# Patient Record
Sex: Male | Born: 1970 | Race: Black or African American | Hispanic: No | Marital: Married | Smoking: Never smoker
Health system: Southern US, Community
[De-identification: ages and names within clinical notes are randomized; demographics above are authoritative.]

## PROBLEM LIST (undated history)

## (undated) DIAGNOSIS — F909 Attention-deficit hyperactivity disorder, unspecified type: Secondary | ICD-10-CM

## (undated) DIAGNOSIS — K219 Gastro-esophageal reflux disease without esophagitis: Secondary | ICD-10-CM

## (undated) DIAGNOSIS — F329 Major depressive disorder, single episode, unspecified: Secondary | ICD-10-CM

## (undated) DIAGNOSIS — G8929 Other chronic pain: Secondary | ICD-10-CM

## (undated) DIAGNOSIS — F32A Depression, unspecified: Secondary | ICD-10-CM

## (undated) HISTORY — PX: FRACTURE SURGERY: SHX138

---

## 2005-09-27 ENCOUNTER — Encounter: Admission: RE | Admit: 2005-09-27 | Discharge: 2005-09-27 | Payer: Self-pay | Admitting: Orthopedic Surgery

## 2005-10-23 ENCOUNTER — Encounter: Admission: RE | Admit: 2005-10-23 | Discharge: 2005-10-23 | Payer: Self-pay | Admitting: Orthopedic Surgery

## 2008-04-18 IMAGING — US US EXTREM LOW VENOUS*L*
1 series · 14 of 24 positions shown · non-contrast
Comparison: None.

CLINICAL DATA: Left calf pain and swelling.   History of surgery on [REDACTED], 8332.
 LEFT LOWER EXTREMITY VENOUS DOPPLER ULTRASOUND:
TECHNIQUE: Gray scale sonography with compression, as well as color and duplex Doppler ultrasound, were performed to evaluate the deep venous system from the level of the common femoral vein through the popliteal and proximal calf veins.

[Series 1: unknown · 14 of 28 slices shown]
[im 1/28]
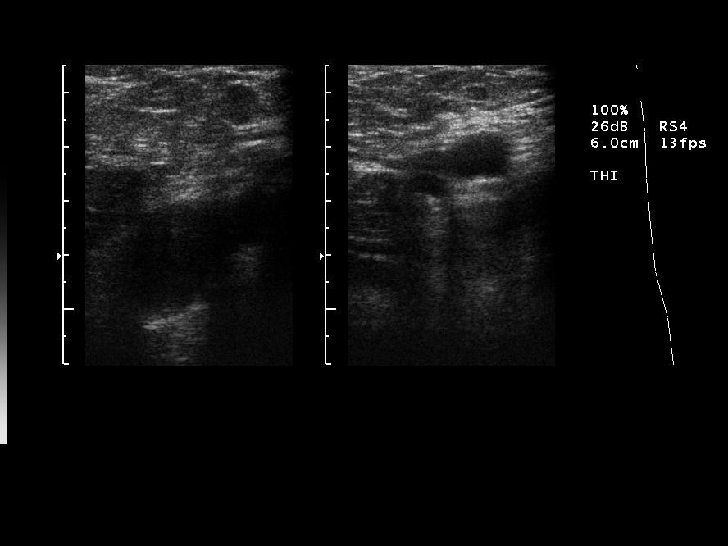
[im 3/28]
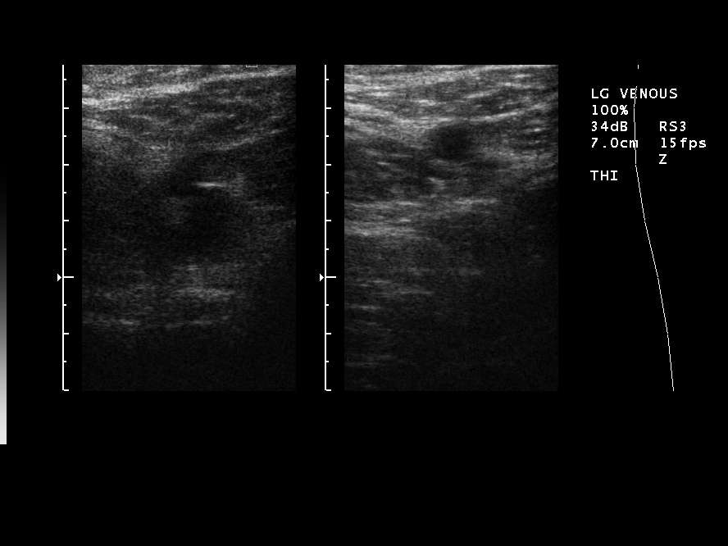
[im 5/28]
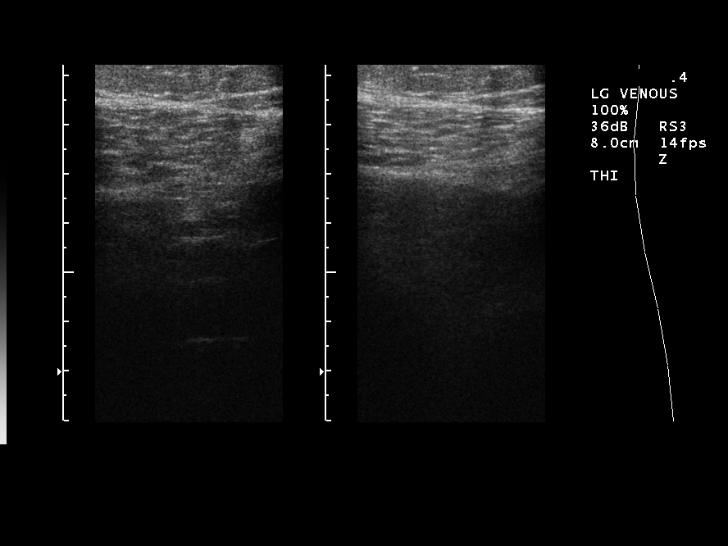
[im 8/28]
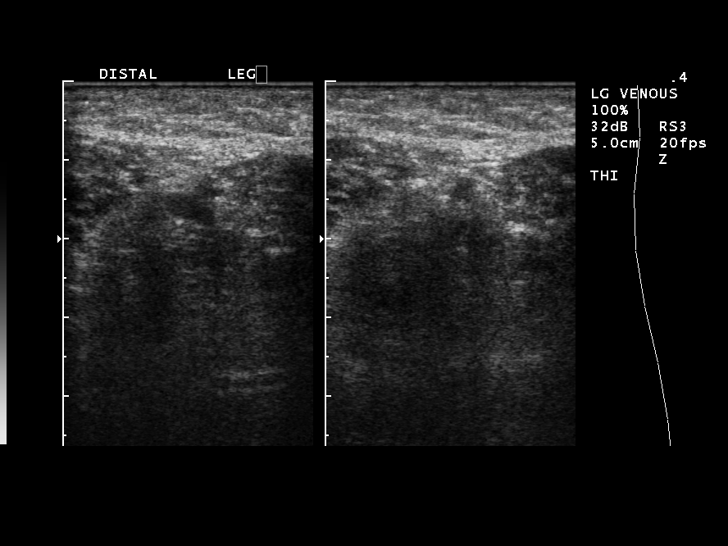
[im 9/28]
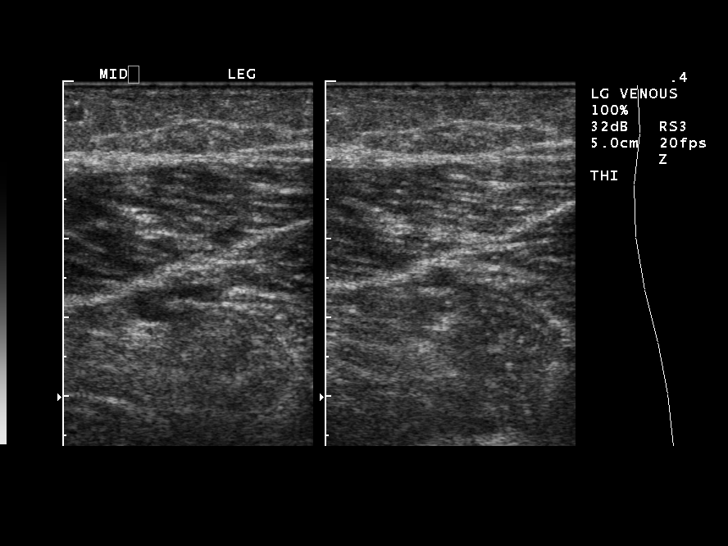
[im 11/28]
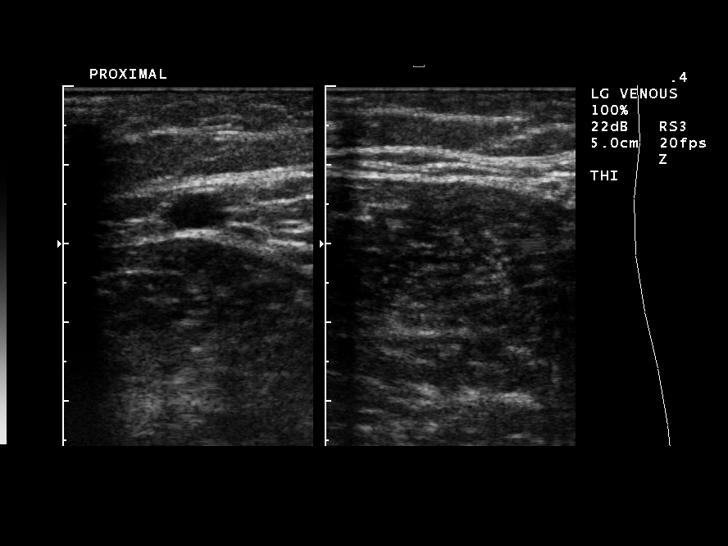
[im 13/28]
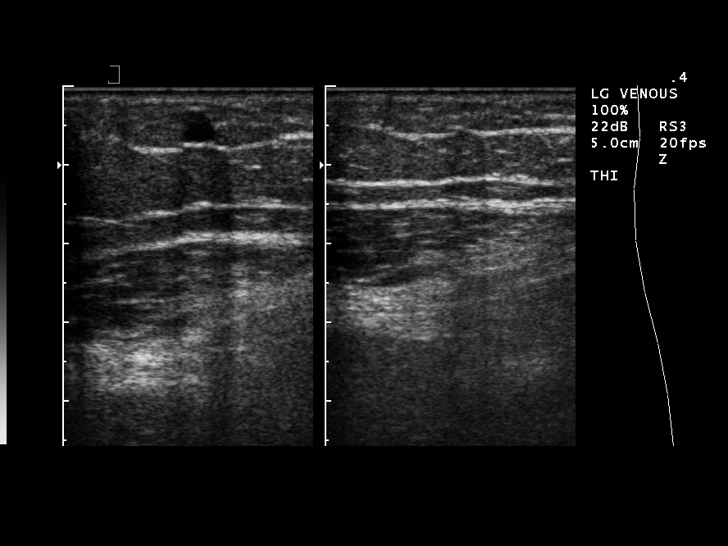
[im 15/28]
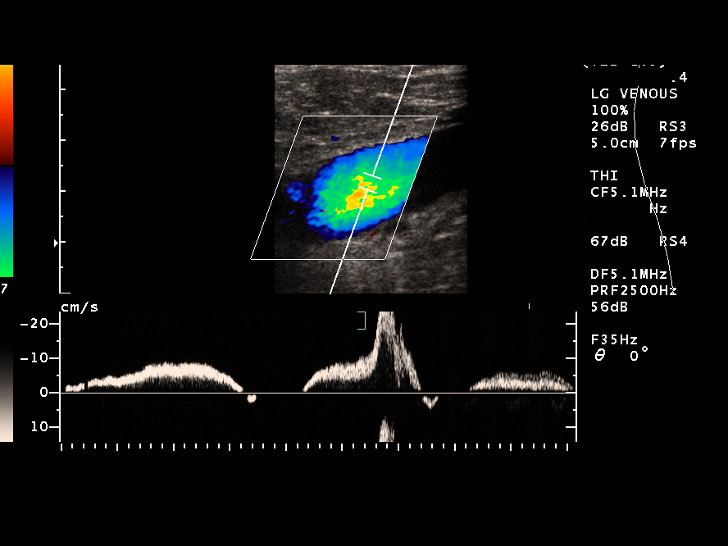
[im 17/28]
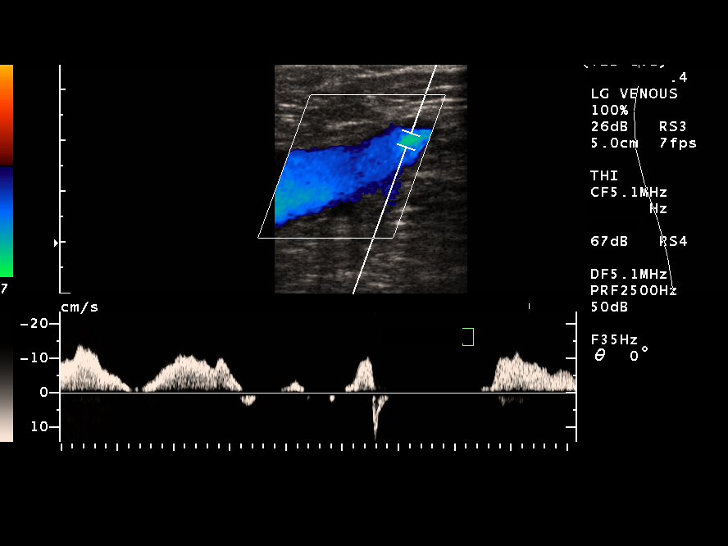
[im 19/28]
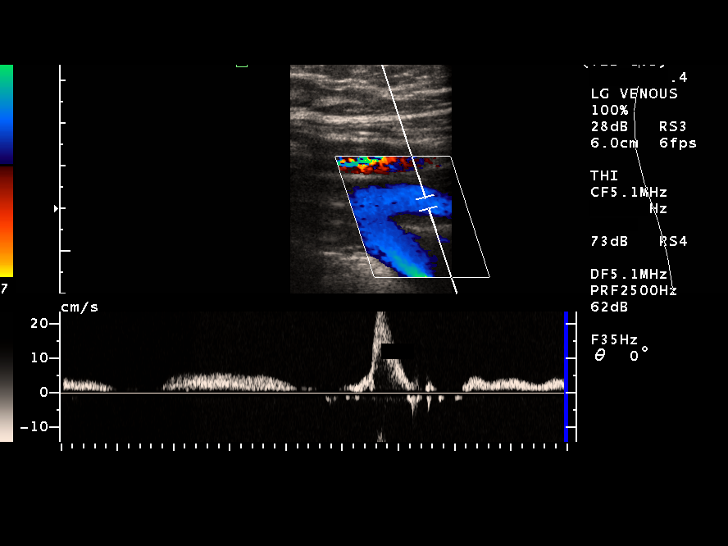
[im 22/28]
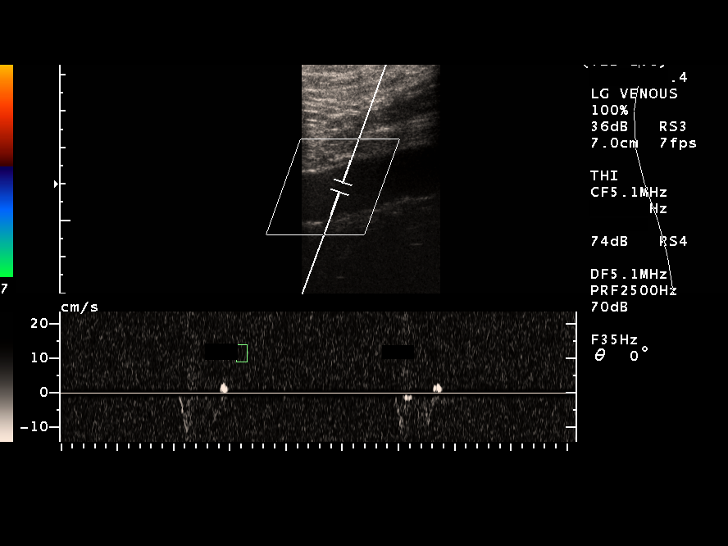
[im 23/28]
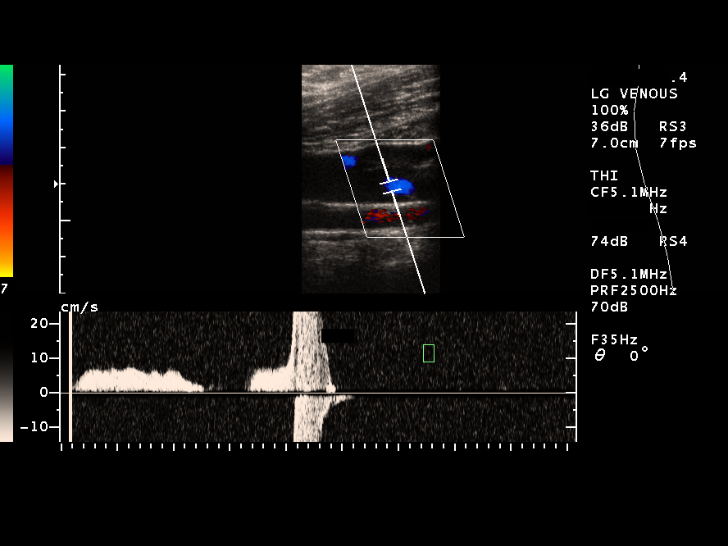
[im 25/28]
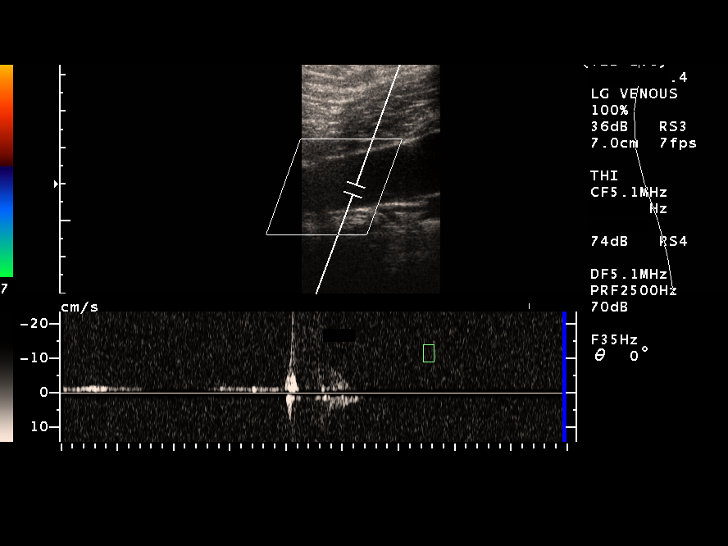
[im 28/28]
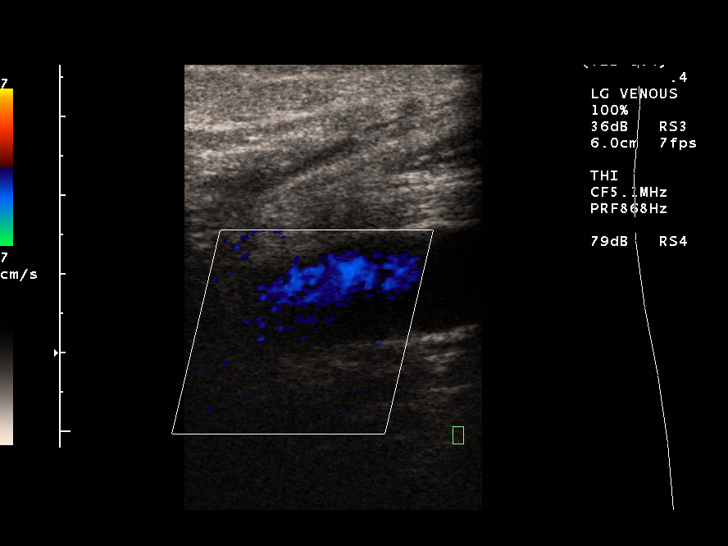

[14 of 24 positions shown; findings below may reference images not displayed]

FINDINGS: Normal flow, compressibility, and augmentation within the left common femoral, superficial femoral, popliteal and posterior tibial veins.  Saphenous system is patent.
IMPRESSION: No DVT.

## 2011-07-19 ENCOUNTER — Emergency Department (INDEPENDENT_AMBULATORY_CARE_PROVIDER_SITE_OTHER)

## 2011-07-19 ENCOUNTER — Encounter (HOSPITAL_BASED_OUTPATIENT_CLINIC_OR_DEPARTMENT_OTHER): Payer: Self-pay | Admitting: Emergency Medicine

## 2011-07-19 ENCOUNTER — Emergency Department (HOSPITAL_BASED_OUTPATIENT_CLINIC_OR_DEPARTMENT_OTHER)
Admission: EM | Admit: 2011-07-19 | Discharge: 2011-07-19 | Disposition: A | Discharge status: Home / Self Care | Attending: Emergency Medicine | Admitting: Emergency Medicine

## 2011-07-19 DIAGNOSIS — R079 Chest pain, unspecified: Secondary | ICD-10-CM

## 2011-07-19 DIAGNOSIS — R0602 Shortness of breath: Secondary | ICD-10-CM

## 2011-07-19 DIAGNOSIS — K219 Gastro-esophageal reflux disease without esophagitis: Secondary | ICD-10-CM | POA: Insufficient documentation

## 2011-07-19 HISTORY — DX: Depression, unspecified: F32.A

## 2011-07-19 HISTORY — DX: Major depressive disorder, single episode, unspecified: F32.9

## 2011-07-19 HISTORY — DX: Gastro-esophageal reflux disease without esophagitis: K21.9

## 2011-07-19 LAB — TROPONIN I
Troponin I: <0.3 ng/mL (ref <0.30)
Troponin I: <0.3 ng/mL (ref <0.30)

## 2011-07-19 LAB — D-DIMER, QUANTITATIVE: D-Dimer, Quant: <0.22 ug/mL-FEU (ref 0.00–0.48)

## 2011-07-19 LAB — BASIC METABOLIC PANEL
CO2: 27 mEq/L (ref 19–32)
Calcium: 8.7 mg/dL (ref 8.4–10.5)
Chloride: 104 mEq/L (ref 96–112)
GFR calc non Af Amer: >90 mL/min (ref >90)
Potassium: 3.8 mEq/L (ref 3.5–5.1)

## 2011-07-19 LAB — CBC: MCV: 88.4 fL (ref 78.0–100.0)

## 2011-07-19 MED ORDER — ASPIRIN 81 MG PO CHEW
324.0000 mg | CHEWABLE_TABLET | Freq: Once | ORAL | Status: AC
  Administered 2011-07-19: 324 mg via ORAL
  Filled 2011-07-19: qty 3

## 2011-07-19 NOTE — ED Notes (Signed)
Patient transported to X-ray 

## 2011-07-19 NOTE — Discharge Instructions (Signed)
Aspirin and Your Heart Aspirin affects the way your blood clots and helps "thin" the blood. Aspirin has many uses in heart disease. It may be used as a primary prevention to help reduce the risk of heart related events. It also can be used as a secondary measure to prevent more heart attacks or to prevent additional damage from blood clots.  ASPIRIN MAY HELP IF YOU:  Have had a heart attack or chest pain.   Have undergone open heart surgery such as CABG (Coronary Artery Bypass Surgery).   Have had coronary angioplasty with or without stents.   Have experienced a stroke or TIA (transient ischemic attack).   Have peripheral vascular disease (PAD).   Have chronic heart rhythm problems such as atrial fibrillation.   Are at risk for heart disease.  BEFORE STARTING ASPIRIN Before you start taking aspirin, your caregiver will need to review your medical history. Many things will need to be taken into consideration, such as:  Smoking status.   Blood pressure.   Diabetes.   Gender.   Weight.   Cholesterol level.  ASPIRIN DOSES  Aspirin should only be taken on the advice of your caregiver. Talk to your caregiver about how much aspirin you should take. Aspirin comes in different doses such as:   81 mg.   162 mg.   325 mg.   The aspirin dose you take may be affected by many factors, some of which include:   Your current medications, especially if your are taking blood-thinners or anti-platelet medicine.   Liver function.   Heart disease risk.   Age.   Aspirin comes in two forms:   Non-enteric-coated. This type of aspirin does not have a coating and is absorbed faster. Non-enteric coated aspirin is recommended for patients experiencing chest pain symptoms. This type of aspirin also comes in a chewable form.   Enteric-coated. This means the aspirin has a special coating that releases the medicine very slowly. Enteric-coated aspirin causes less stomach upset. This type of  aspirin should not be chewed or crushed.  ASPIRIN SIDE EFFECTS Daily use of aspirin can increase your risk of serious side effects, some of these include:  Increased bleeding. This can range from a cut that does not stop bleeding to more serious problems such as stomach bleeding or bleeding into the brain (Intracerebral bleeding).   Increased bruising.   Stomach upset.   An allergic reaction such as red, itchy skin.   Increased risk of bleeding when combined with non-steroidal anti-inflammatory medicine (NSAIDS).   Alcohol should be drank in moderation when taking aspirin. Alcohol can increase the risk of stomach bleeding when taken with aspirin.   Aspirin should not be given to children less than 18 years of age due to the association of Reye syndrome. Reye syndrome is a serious illness that can affect the brain and liver. Studies have linked Reye syndrome with aspirin use in children.   People that have nasal polyps have an increased risk of developing an aspirin allergy.  SEEK MEDICAL CARE IF:   You develop an allergic reaction such as:   Hives.   Itchy skin.   Swelling of the lips, tongue or face.   You develop stomach pain.   You have unusual bleeding or bruising.   You have ringing in your ears.  SEEK IMMEDIATE MEDICAL CARE IF:   You have severe chest pain, especially if the pain is crushing or pressure-like and spreads to the arms, back, neck, or jaw. THIS   IS AN EMERGENCY. Do not wait to see if the pain will go away. Get medical help at once. Call your local emergency services (911 in the [place] DO NOT drive yourself to the hospital.   You have stroke-like symptoms such as:   Loss of vision.   Difficulty talking.   Numbness or weakness on one side of your body.   Numbness or weakness in your arm or leg.   Not thinking clearly or feeling confused.   Your bowel movements are bloody, dark red or black in color.   You vomit or cough up blood.   You have blood  in your urine.   You have shortness of breath, coughing or wheezing.  MAKE SURE YOU:   Understand these instructions.   Will monitor your condition.   Seek immediate medical care if necessary.  Document Released: 03/02/2008 Document Revised: 03/09/2011 Document Reviewed: 03/02/2008 [facility] Patient Information 2012 [place], [place].  Recommend starting a baby aspirin a day. Return for new or worse symptoms. Resource guide provided to help you find a primary care doctor as needed. If you have a primary care doctor followup in the next few days.    RESOURCE GUIDE  Dental Problems  Patients with Medicaid: [facility] [phone] [address]                                           [phone] [address] Phone:  [phone]                                                  Phone:  [phone]  If unable to pay or uninsured, contact:  [facility] or [facility]. to become qualified for the adult dental clinic.  Chronic Pain Problems Contact [facility]  [phone] Patients need to be referred by their primary care doctor.  Insufficient Money for Medicine Contact [employer]:  call "211" or [facility] 636-090-0359.  No Primary Care Doctor Call [facility]  [phone] Other agencies that provide inexpensive medical care    [facility]  [phone]    [facility]  [phone]    [facility]  [phone]    [facility]  [phone]    [facility]  978-600-2450    [facility]  [phone]  Psychological Services [facility] Behavioral Health  [phone] [facility] Services  [phone] [facility] Mental Health   [phone] (emergency services (431)[phone])  Substance Abuse Resources [facility]  (808)870-7151 [facility] [phone] [facility] ([phone] [facility] (307) [phone] Residential & Outpatient Substance Abuse  Program  (978)583-6574  Abuse/Neglect [facility] Child Abuse Hotline 705-101-7346 [facility] Child Abuse Hotline (562) 013-5865 (After Hours)  Emergency Shelter [facility] (484)442-5593  Maternity Homes [facility] at the [place] [facility] [place] (902)[phone] [facility] [phone]  MRSA Hotline #:   616-274-8438    [facility]  [facility] [address] [place]                       [address]      [address]  [place]                                                [place]                            [place] Phone:  [phone]                                   Phone:  [phone]                 Phone:  [phone]  [place] [facility] Phone:  [phone]  [place] Child Abuse Hotline (336) 342-1394 (336) [phone] (After Hours)    

## 2011-07-19 NOTE — ED Notes (Signed)
I met patient in waiting room with complaint of chest pain, I took patient back to room, took vitals and got ecg. I gave copy to Dr. Jodelle Gross.

## 2011-07-19 NOTE — ED Provider Notes (Signed)
History     CSN: [id number]  Arrival date & time 07/19/11  1005   First MD Initiated Contact with Patient 07/19/11 1024      Chief Complaint  Patient presents with  . Chest Pain    (Consider location/radiation/quality/duration/timing/severity/associated sxs/prior treatment) Patient is a 41 y.o. male presenting with chest pain. The history is provided by the patient.  Chest Pain The chest pain began 1 - 2 hours ago (Onset of chest pain was approximately 9:30 in the morning. This is when the pain became constant patient has had intermittent chest pain on and off for several days. Approximately 3 days.). Chest pain occurs constantly. The chest pain is improving. At its most intense, the pain is at 6/10. The severity of the pain is moderate. The quality of the pain is described as aching and dull. The pain does not radiate. Primary symptoms include shortness of breath. Pertinent negatives for primary symptoms include no fever, no syncope, no cough, no wheezing, no abdominal pain, no nausea, no vomiting and no dizziness.     Past Medical History  Diagnosis Date  . Depression   . GERD (gastroesophageal reflux disease)     Past Surgical History  Procedure Date  . Fracture surgery     No family history on file.  History  Substance Use Topics  . Smoking status: Not on file  . Smokeless tobacco: Not on file  . Alcohol Use: Yes     Drinks occasionally and may be wine, beer or liquor      Review of Systems  Constitutional: Negative for fever.  HENT: Negative for congestion and neck pain.   Eyes: Negative for visual disturbance.  Respiratory: Positive for shortness of breath. Negative for cough and wheezing.   Cardiovascular: Positive for chest pain. Negative for syncope.  Gastrointestinal: Negative for nausea, vomiting and abdominal pain.  Genitourinary: Positive for dysuria.  Musculoskeletal: Negative for back pain.  Skin: Negative for rash.  Neurological: Negative for  dizziness and headaches.  Hematological: Does not bruise/bleed easily.    Allergies  Doxycycline and Tetracyclines & related  Home Medications   Current Outpatient Rx  Name Route Sig Dispense Refill  . PANTOPRAZOLE SODIUM 40 MG PO TBEC Oral Take 40 mg by mouth daily.    Marland Kitchen TACROLIMUS 0.1 % EX OINT Topical Apply 1 application topically 2 (two) times daily. Patient uses this medication for skin.      BP 132/81  Pulse 63  Temp(Src) 97.9 F (36.6 C) (Oral)  Resp 16  SpO2 97%  Physical Exam  Nursing note and vitals reviewed. Constitutional: He is oriented to person, place, and time. He appears well-developed and well-nourished. No distress.  HENT:  Head: Normocephalic and atraumatic.  Mouth/Throat: Oropharynx is clear and moist.  Eyes: Conjunctivae and EOM are normal. Pupils are equal, round, and reactive to light.  Neck: Normal range of motion. Neck supple.  Cardiovascular: Normal rate, regular rhythm and normal heart sounds.   No murmur heard. Pulmonary/Chest: Effort normal and breath sounds normal. No respiratory distress. He has no wheezes. He has no rales. He exhibits no tenderness.  Abdominal: Soft. Bowel sounds are normal. There is no tenderness.  Musculoskeletal: Normal range of motion. He exhibits no edema.  Neurological: He is alert and oriented to person, place, and time. No cranial nerve deficit. He exhibits normal muscle tone. Coordination normal.  Skin: Skin is warm. No rash noted.    ED Course  Procedures (including critical care time)  Labs Reviewed  CBC - Abnormal; Notable for the following:    MCHC 36.5 (*)    Platelets 134 (*)    All other components within normal limits  BASIC METABOLIC PANEL - Abnormal; Notable for the following:    Glucose, Bld 68 (*)    All other components within normal limits  TROPONIN I  D-DIMER, QUANTITATIVE  TROPONIN I   Dg Chest 2 View  07/19/2011  *RADIOLOGY REPORT*  Clinical Data: Chest pain, shortness of breath  CHEST -  2 VIEW  Comparison: None.  Findings: Cardiomediastinal silhouette is unremarkable.  No acute infiltrate or pleural effusion.  No pulmonary edema.  Bony thorax is unremarkable.  IMPRESSION: No active disease.  Original Report Authenticated By: Natasha Mead, M.D.   Results for orders placed during the hospital encounter of 07/19/11  CBC      Component Value Range   WBC 4.8  4.0 - 10.5 (K/uL)   RBC 4.74  4.22 - 5.81 (MIL/uL)   Hemoglobin 15.3  13.0 - 17.0 (g/dL)   HCT 16.1  09.6 - 04.5 (%)   MCV 88.4  78.0 - 100.0 (fL)   MCH 32.3  26.0 - 34.0 (pg)   MCHC 36.5 (*) 30.0 - 36.0 (g/dL)   RDW 40.9  81.1 - 91.4 (%)   Platelets 134 (*) 150 - 400 (K/uL)  BASIC METABOLIC PANEL      Component Value Range   Sodium 140  135 - 145 (mEq/L)   Potassium 3.8  3.5 - 5.1 (mEq/L)   Chloride 104  96 - 112 (mEq/L)   CO2 27  19 - 32 (mEq/L)   Glucose, Bld 68 (*) 70 - 99 (mg/dL)   BUN 12  6 - 23 (mg/dL)   Creatinine, Ser 7.82  0.50 - 1.35 (mg/dL)   Calcium 8.7  8.4 - 95.6 (mg/dL)   GFR calc non Af Amer >90  >90 (mL/min)   GFR calc Af Amer >90  >90 (mL/min)  TROPONIN I      Component Value Range   Troponin I <0.30  <0.30 (ng/mL)  D-DIMER, QUANTITATIVE      Component Value Range   D-Dimer, Quant <0.22  0.00 - 0.48 (ug/mL-FEU)     Date: 07/19/2011  Rate: 72  Rhythm: normal sinus rhythm  QRS Axis: normal  Intervals: normal  ST/T Wave abnormalities: normal  Conduction Disutrbances:first-degree A-V block   Narrative Interpretation:   Old EKG Reviewed: none available Correction no conduction disturbance no first degree AV block.   1. Chest pain       MDM   Initial lab workup without specific findings EKG without acute changes troponin negative d-dimer negative chest x-ray without pneumonia or pneumothorax.   Repeat troponin at 3:30 represents a six-hour troponin mark that is still pending.  As stated above initial workup without evidence of pulmonary embolism in no acute cardiac changes. Labs  without specific abnormalities.  If patient discharged home recommend starting baby aspirin a day resource guide provided for referrals to primary care doctors.        Shelda Jakes, MD 07/19/11 [phone]

## 2011-07-19 NOTE — ED Notes (Signed)
Patient states he started having chest pain approximately 3 days ago.  Pain is midsternal and non radiating.  Pain has been intermittent and patient is unable to give characteristics.  Has had nausea dizziness.  Has used antacids due to heart burn but states did not change feeling in chest.

## 2013-10-20 ENCOUNTER — Encounter (HOSPITAL_BASED_OUTPATIENT_CLINIC_OR_DEPARTMENT_OTHER): Payer: Self-pay | Admitting: Emergency Medicine

## 2013-10-20 DIAGNOSIS — Z8659 Personal history of other mental and behavioral disorders: Secondary | ICD-10-CM | POA: Insufficient documentation

## 2013-10-20 DIAGNOSIS — K2970 Gastritis, unspecified, without bleeding: Secondary | ICD-10-CM | POA: Insufficient documentation

## 2013-10-20 DIAGNOSIS — K219 Gastro-esophageal reflux disease without esophagitis: Secondary | ICD-10-CM | POA: Insufficient documentation

## 2013-10-20 DIAGNOSIS — R197 Diarrhea, unspecified: Secondary | ICD-10-CM | POA: Insufficient documentation

## 2013-10-20 DIAGNOSIS — K2990 Gastroduodenitis, unspecified, without bleeding: Principal | ICD-10-CM

## 2013-10-20 NOTE — ED Notes (Signed)
Pt reports that he has had abominal pain x 1 month, has been seen at his pcp office and treated for refux, , reports significant loss of weight in last month. States that he has continued to reflux medication with no improvement

## 2013-10-20 NOTE — ED Provider Notes (Signed)
CSN: [id number]     Arrival date & time 10/20/13  2348 History  This chart was scribed for Guy SeamenJohn L Charmain Diosdado, MD by Nicholos Johnsenise Iheanachor, ED scribe. This patient was seen in room MH02/MH02 and the patient's care was started at 12:06 AM.     Chief Complaint  Patient presents with  . Abdominal Pain   The history is provided by the patient. No language interpreter was used.   HPI Comments: Guy Bryant is a 43 y.o. male who presents to the Emergency Department complaining of sharp, burning, non-radiating epigastric abdominal pain for the past month. Associated nausea and diarrhea. He has had only 3 episodes of emesis in the past month. Significantly worse after eating. Last ate 6 hours ago. Been seen by PCP and diagnosed with acid reflux. Prescribed Protonix which pt has been compliant with without relief. Been using Meloxicam more to avoid having to take oxycodone. Denies SOB, chest pain, urinary trouble. Describes pain as about a 6/10 presently.  Past Medical History  Diagnosis Date  . Depression   . GERD (gastroesophageal reflux disease)    Past Surgical History  Procedure Laterality Date  . Fracture surgery     No family history on file. History  Substance Use Topics  . Smoking status: Not on file  . Smokeless tobacco: Not on file  . Alcohol Use: Yes     Comment: Drinks occasionally and may be wine, beer or liquor    Review of Systems  Respiratory: Negative for shortness of breath.   Cardiovascular: Negative for chest pain.  Gastrointestinal: Positive for nausea, abdominal pain and diarrhea. Negative for vomiting.  Genitourinary: Negative for dysuria, urgency, frequency, decreased urine volume and difficulty urinating.  All other systems reviewed and are negative.  Allergies  Doxycycline and Tetracyclines & related  Home Medications   Prior to Admission medications   Medication Sig Start Date End Date Taking? Authorizing Provider  oxycodone (OXYCONTIN) 30 MG TB12 Take 30  mg by mouth every 12 (twelve) hours. Patient uses this medication for pain.    Historical Provider, MD  pantoprazole (PROTONIX) 40 MG tablet Take 40 mg by mouth daily.    Historical Provider, MD  tacrolimus (PROTOPIC) 0.1 % ointment Apply 1 application topically 2 (two) times daily. Patient uses this medication for skin.    Historical Provider, MD   Triage vitals: BP 129/87  Pulse 65  Temp(Src) 98.4 F (36.9 C) (Oral)  Resp 20  Ht 6\' 6"  (1.981 m)  Wt 275 lb (124.739 kg)  BMI 31.79 kg/m2  SpO2 100%  Physical Exam  Nursing note and vitals reviewed. General: Well-developed, well-nourished male in no acute distress; appearance consistent with age of record HENT: normocephalic; atraumatic Eyes: pupils equal, round and reactive to light; extraocular muscles intact Neck: supple Heart: regular rate and rhythm; no murmurs, rubs or gallops Lungs: clear to auscultation bilaterally Abdomen: epigastric tenderness, no RUQ or LUQ tenderness; otherwise soft; nondistended; no masses or hepatosplenomegaly; bowel sounds present Extremities: No deformity; full range of motion; pulses normal Neurologic: Awake, alert and oriented; motor function intact in all extremities and symmetric; no facial droop Skin: Warm and dry Psychiatric: Normal mood and affect   ED Course  Procedures (including critical care time) DIAGNOSTIC STUDIES: Oxygen Saturation is 100% on room air, normal by my interpretation.    COORDINATION OF CARE: At 12:10 AM: Discussed treatment plan with patient which includes lab work. Patient agrees.     MDM   Nursing notes and vitals  signs, including pulse oximetry, reviewed.  Summary of this visit's results, reviewed by myself:  Labs:  Results for orders placed during the hospital encounter of 10/21/13 (from the past 24 hour(s))  COMPREHENSIVE METABOLIC PANEL     Status: Abnormal   Collection Time    10/21/13 12:30 AM      Result Value Ref Range   Sodium 139  137 - 147 mEq/L    Potassium 3.9  3.7 - 5.3 mEq/L   Chloride 104  96 - 112 mEq/L   CO2 25  19 - 32 mEq/L   Glucose, Bld 97  70 - 99 mg/dL   BUN 17  6 - 23 mg/dL   Creatinine, Ser 1.61  0.50 - 1.35 mg/dL   Calcium 8.9  8.4 - 09.6 mg/dL   Total Protein 6.9  6.0 - 8.3 g/dL   Albumin 3.8  3.5 - 5.2 g/dL   AST 21  0 - 37 U/L   ALT 24  0 - 53 U/L   Alkaline Phosphatase 64  39 - 117 U/L   Total Bilirubin 0.4  0.3 - 1.2 mg/dL   GFR calc non Af Amer 81 (*) >90 mL/min   GFR calc Af Amer >90  >90 mL/min   Anion gap 10  5 - 15  CBC WITH DIFFERENTIAL     Status: Abnormal   Collection Time    10/21/13 12:30 AM      Result Value Ref Range   WBC 4.9  4.0 - 10.5 K/uL   RBC 4.74  4.22 - 5.81 MIL/uL   Hemoglobin 15.3  13.0 - 17.0 g/dL   HCT 04.5  40.9 - 81.1 %   MCV 89.9  78.0 - 100.0 fL   MCH 32.3  26.0 - 34.0 pg   MCHC 35.9  30.0 - 36.0 g/dL   RDW 91.4  78.2 - 95.6 %   Platelets 132 (*) 150 - 400 K/uL   Neutrophils Relative % 53  43 - 77 %   Neutro Abs 2.6  1.7 - 7.7 K/uL   Lymphocytes Relative 37  12 - 46 %   Lymphs Abs 1.8  0.7 - 4.0 K/uL   Monocytes Relative 10  3 - 12 %   Monocytes Absolute 0.5  0.1 - 1.0 K/uL   Eosinophils Relative 1  0 - 5 %   Eosinophils Absolute 0.0  0.0 - 0.7 K/uL   Basophils Relative 0  0 - 1 %   Basophils Absolute 0.0  0.0 - 0.1 K/uL  LIPASE, BLOOD     Status: None   Collection Time    10/21/13 12:30 AM      Result Value Ref Range   Lipase 52  11 - 59 U/L   1:14 AM Pain partially relieved by Carafate. We will start him on Carafate 4 times a day and refer him back to the [place]. Advised that meloxicam if potentially irritating to his stomach lining, and he should consider D/Cing it.   I personally performed the services described in this documentation, which was scribed in my presence. The recorded information has been reviewed and is accurate.     Guy Seamen, MD 10/21/13 [phone]

## 2013-10-21 ENCOUNTER — Emergency Department (HOSPITAL_BASED_OUTPATIENT_CLINIC_OR_DEPARTMENT_OTHER)
Admission: EM | Admit: 2013-10-21 | Discharge: 2013-10-21 | Disposition: A | Discharge status: Home / Self Care | Attending: Emergency Medicine | Admitting: Emergency Medicine

## 2013-10-21 DIAGNOSIS — K2970 Gastritis, unspecified, without bleeding: Secondary | ICD-10-CM

## 2013-10-21 LAB — CBC WITH DIFFERENTIAL/PLATELET
BASOS PCT: 0 % (ref 0–1)
Basophils Absolute: 0 10*3/uL (ref 0.0–0.1)
EOS ABS: 0 10*3/uL (ref 0.0–0.7)
Eosinophils Relative: 1 % (ref 0–5)
HCT: 42.6 % (ref 39.0–52.0)
HEMOGLOBIN: 15.3 g/dL (ref 13.0–17.0)
LYMPHS PCT: 37 % (ref 12–46)
Lymphs Abs: 1.8 10*3/uL (ref 0.7–4.0)
MCH: 32.3 pg (ref 26.0–34.0)
MCHC: 35.9 g/dL (ref 30.0–36.0)
MCV: 89.9 fL (ref 78.0–100.0)
MONO ABS: 0.5 10*3/uL (ref 0.1–1.0)
Monocytes Relative: 10 % (ref 3–12)
NEUTROS ABS: 2.6 10*3/uL (ref 1.7–7.7)
Neutrophils Relative %: 53 % (ref 43–77)
PLATELETS: 132 10*3/uL — AB (ref 150–400)
RBC: 4.74 MIL/uL (ref 4.22–5.81)
RDW: 12.5 % (ref 11.5–15.5)
WBC: 4.9 10*3/uL (ref 4.0–10.5)

## 2013-10-21 LAB — COMPREHENSIVE METABOLIC PANEL
ALBUMIN: 3.8 g/dL (ref 3.5–5.2)
ALT: 24 U/L (ref 0–53)
ANION GAP: 10 (ref 5–15)
AST: 21 U/L (ref 0–37)
Alkaline Phosphatase: 64 U/L (ref 39–117)
BUN: 17 mg/dL (ref 6–23)
CALCIUM: 8.9 mg/dL (ref 8.4–10.5)
CHLORIDE: 104 meq/L (ref 96–112)
CO2: 25 meq/L (ref 19–32)
CREATININE: 1.1 mg/dL (ref 0.50–1.35)
GFR calc Af Amer: >90 mL/min (ref >90)
GFR, EST NON AFRICAN AMERICAN: 81 mL/min — AB (ref >90)
Glucose, Bld: 97 mg/dL (ref 70–99)
POTASSIUM: 3.9 meq/L (ref 3.7–5.3)
SODIUM: 139 meq/L (ref 137–147)
Total Bilirubin: 0.4 mg/dL (ref 0.3–1.2)
Total Protein: 6.9 g/dL (ref 6.0–8.3)

## 2013-10-21 LAB — LIPASE, BLOOD: LIPASE: 52 U/L (ref 11–59)

## 2013-10-21 MED ORDER — SODIUM CHLORIDE 0.9 % IV SOLN
INTRAVENOUS | Status: DC
  Administered 2013-10-21: 01:00:00 via INTRAVENOUS

## 2013-10-21 MED ORDER — SUCRALFATE 1 G PO TABS
1.0000 g | ORAL_TABLET | Freq: Once | ORAL | Status: AC
  Administered 2013-10-21: 1 g via ORAL
  Filled 2013-10-21: qty 1

## 2013-10-21 MED ORDER — SUCRALFATE 1 G PO TABS: 1.0000 g | ORAL_TABLET | Freq: Three times a day (TID) | ORAL | Status: AC

## 2013-10-21 NOTE — Discharge Instructions (Signed)

## 2013-10-21 NOTE — ED Notes (Signed)
Pt reports 14 lb weight loss in last month

## 2013-10-21 NOTE — ED Notes (Signed)
MD at bedside. 

## 2014-01-12 IMAGING — CR DG CHEST 2V
2 series · 2 of 2 positions shown · non-contrast
Comparison: None.

CLINICAL DATA: Chest pain, shortness of breath

CHEST - 2 VIEW

[w chest pa]
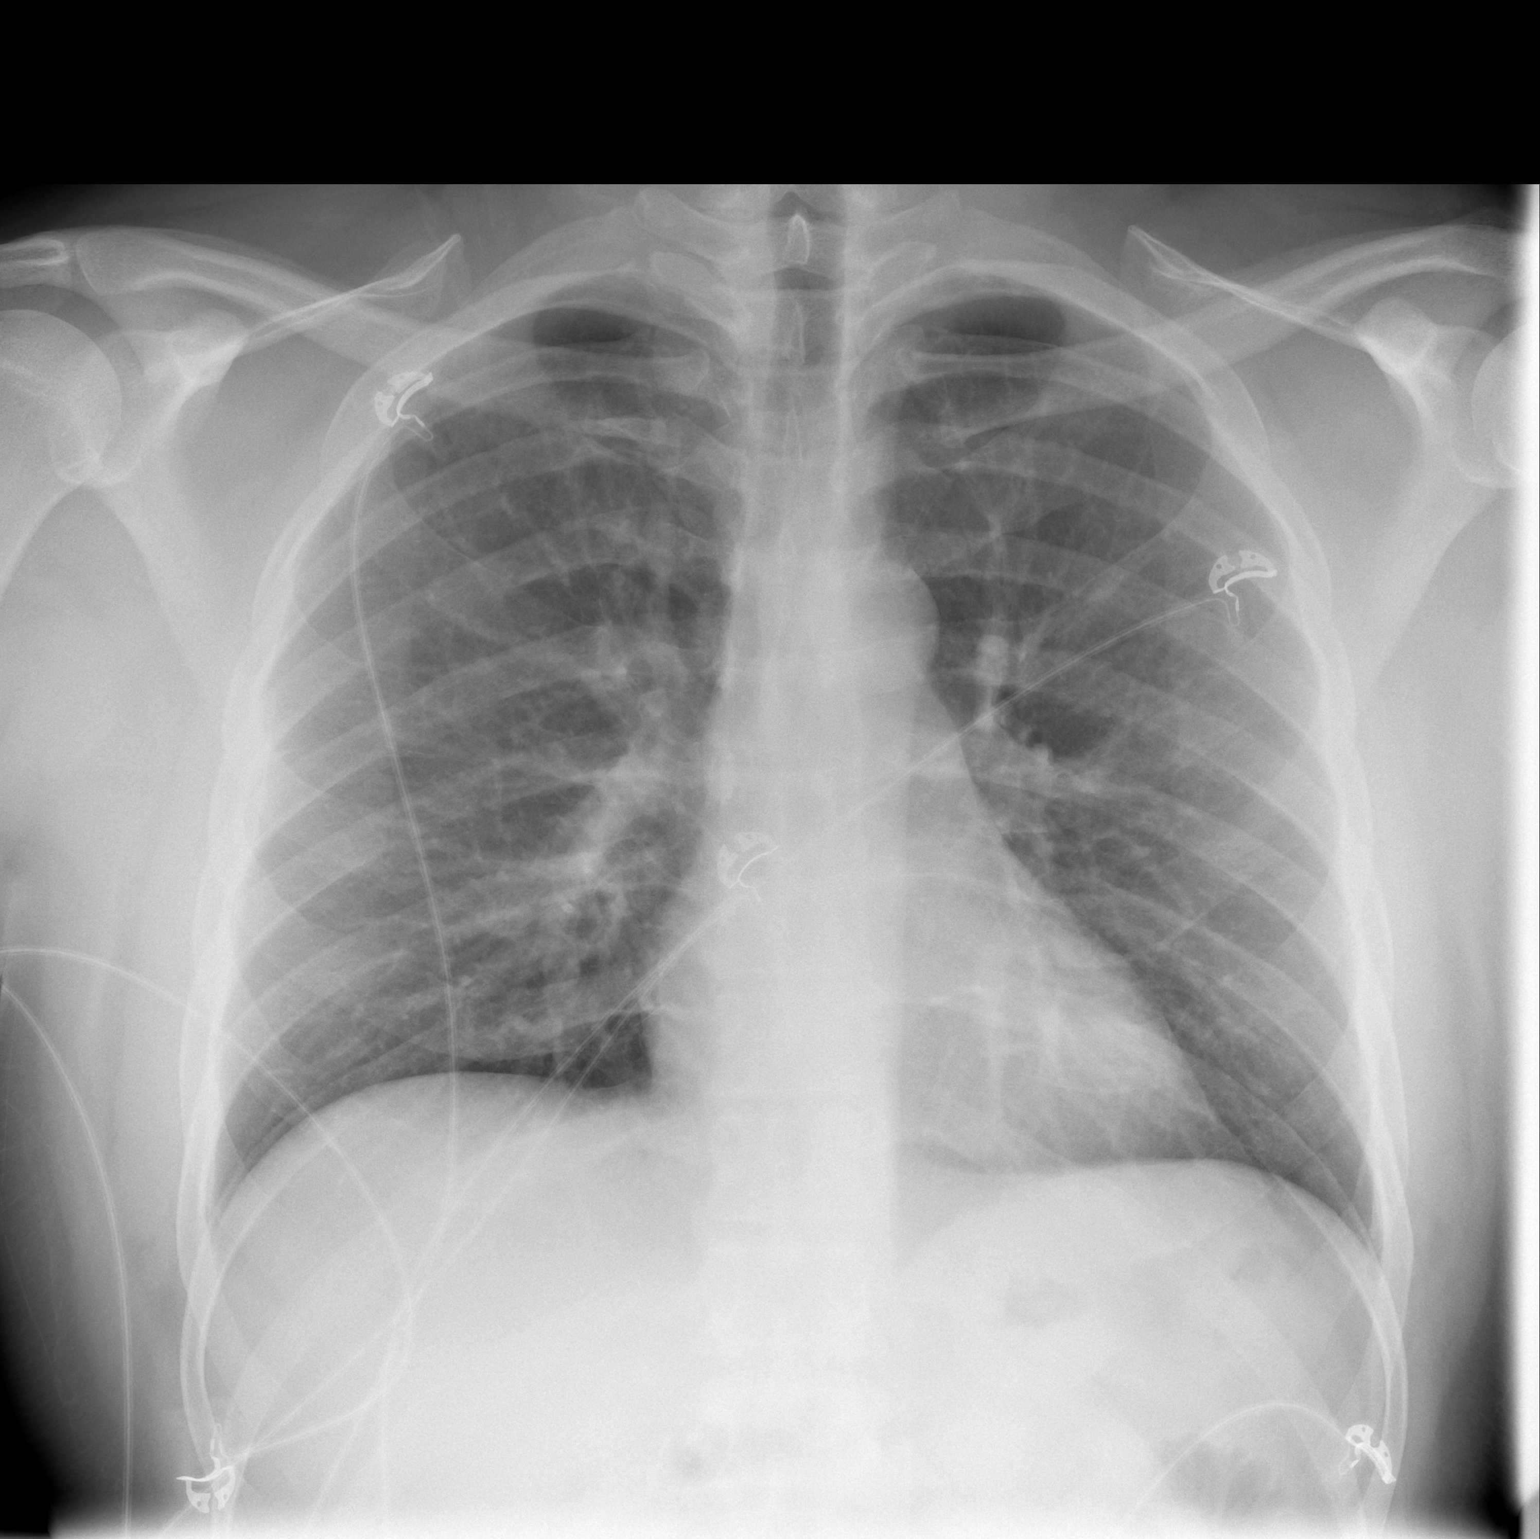

[w chest lat]
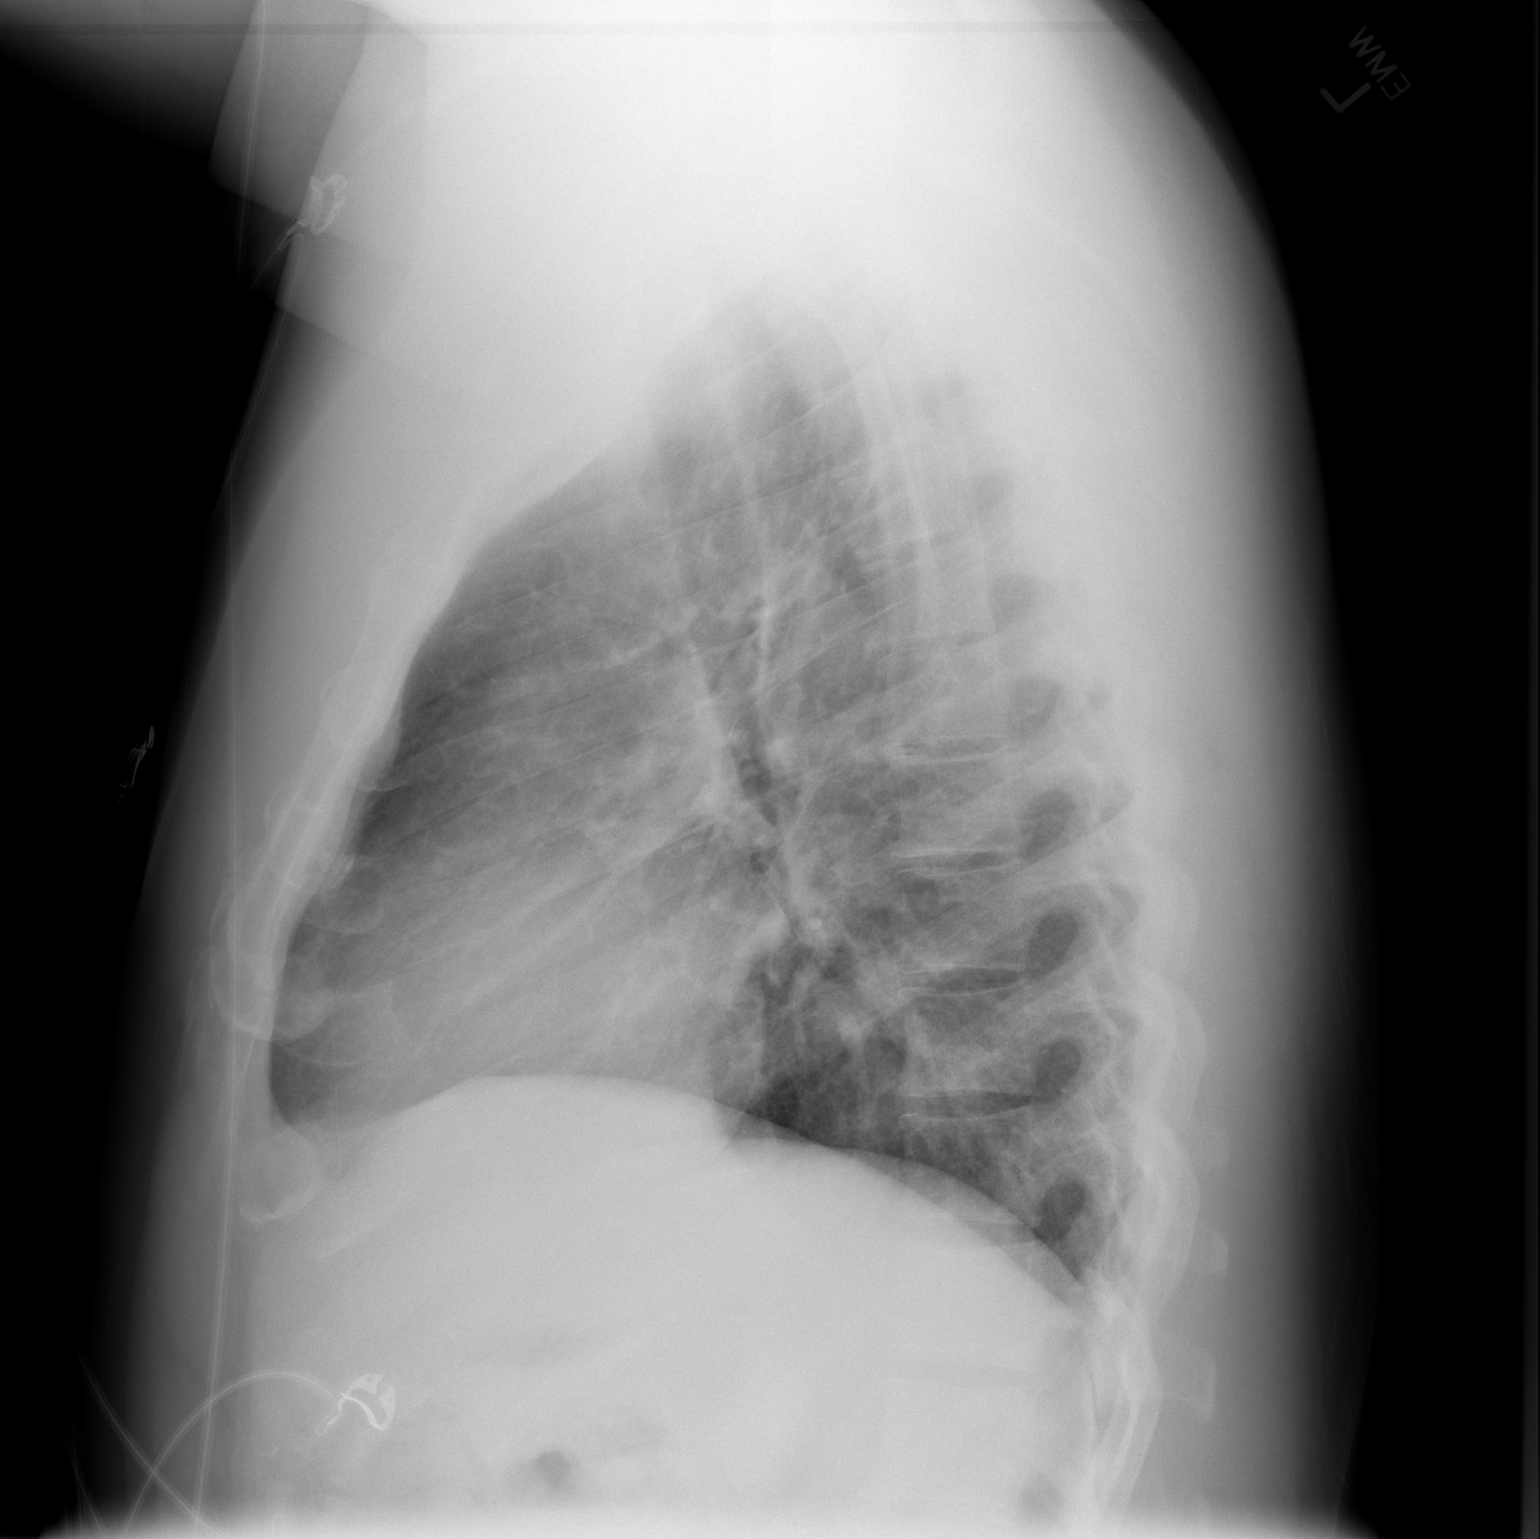

[2 of 2 positions shown; findings below may reference images not displayed]

FINDINGS: Cardiomediastinal silhouette is unremarkable.  No acute
infiltrate or pleural effusion.  No pulmonary edema.  Bony thorax
is unremarkable.
IMPRESSION: No active disease.

## 2014-05-14 ENCOUNTER — Emergency Department (HOSPITAL_BASED_OUTPATIENT_CLINIC_OR_DEPARTMENT_OTHER): Payer: Non-veteran care

## 2014-05-14 ENCOUNTER — Emergency Department (HOSPITAL_BASED_OUTPATIENT_CLINIC_OR_DEPARTMENT_OTHER)
Admission: EM | Admit: 2014-05-14 | Discharge: 2014-05-14 | Disposition: A | Discharge status: Home / Self Care | Payer: Non-veteran care | Attending: Emergency Medicine | Admitting: Emergency Medicine

## 2014-05-14 ENCOUNTER — Encounter (HOSPITAL_BASED_OUTPATIENT_CLINIC_OR_DEPARTMENT_OTHER): Payer: Self-pay

## 2014-05-14 DIAGNOSIS — F909 Attention-deficit hyperactivity disorder, unspecified type: Secondary | ICD-10-CM | POA: Insufficient documentation

## 2014-05-14 DIAGNOSIS — N508 Other specified disorders of male genital organs: Secondary | ICD-10-CM | POA: Insufficient documentation

## 2014-05-14 DIAGNOSIS — K219 Gastro-esophageal reflux disease without esophagitis: Secondary | ICD-10-CM | POA: Insufficient documentation

## 2014-05-14 DIAGNOSIS — F329 Major depressive disorder, single episode, unspecified: Secondary | ICD-10-CM | POA: Diagnosis not present

## 2014-05-14 DIAGNOSIS — Z79899 Other long term (current) drug therapy: Secondary | ICD-10-CM | POA: Diagnosis not present

## 2014-05-14 DIAGNOSIS — N50812 Left testicular pain: Secondary | ICD-10-CM

## 2014-05-14 DIAGNOSIS — G8929 Other chronic pain: Secondary | ICD-10-CM | POA: Insufficient documentation

## 2014-05-14 HISTORY — DX: Other chronic pain: G89.29

## 2014-05-14 HISTORY — DX: Attention-deficit hyperactivity disorder, unspecified type: F90.9

## 2014-05-14 LAB — URINALYSIS, ROUTINE W REFLEX MICROSCOPIC
BILIRUBIN URINE: NEGATIVE
GLUCOSE, UA: NEGATIVE mg/dL
HGB URINE DIPSTICK: NEGATIVE
Ketones, ur: NEGATIVE mg/dL
Nitrite: NEGATIVE
PH: 7 (ref 5.0–8.0)
Protein, ur: NEGATIVE mg/dL
SPECIFIC GRAVITY, URINE: 1.026 (ref 1.005–1.030)
UROBILINOGEN UA: 1 mg/dL (ref 0.0–1.0)

## 2014-05-14 LAB — URINE MICROSCOPIC-ADD ON

## 2014-05-14 MED ORDER — METRONIDAZOLE 500 MG PO TABS
2000.0000 mg | ORAL_TABLET | ORAL | Status: AC
  Administered 2014-05-14: 2000 mg via ORAL
  Filled 2014-05-14: qty 4

## 2014-05-14 MED ORDER — ACETAMINOPHEN 325 MG PO TABS
650.0000 mg | ORAL_TABLET | Freq: Once | ORAL | Status: AC
  Administered 2014-05-14: 650 mg via ORAL
  Filled 2014-05-14: qty 2

## 2014-05-14 MED ORDER — AZITHROMYCIN 250 MG PO TABS
1000.0000 mg | ORAL_TABLET | Freq: Once | ORAL | Status: AC
  Administered 2014-05-14: 1000 mg via ORAL
  Filled 2014-05-14: qty 4

## 2014-05-14 MED ORDER — CEFTRIAXONE SODIUM 250 MG IJ SOLR
250.0000 mg | Freq: Once | INTRAMUSCULAR | Status: AC
  Administered 2014-05-14: 250 mg via INTRAMUSCULAR
  Filled 2014-05-14: qty 250

## 2014-05-14 NOTE — ED Provider Notes (Signed)
CSN: [id number]     Arrival date & time 05/14/14  1046 History   First MD Initiated Contact with Patient 05/14/14 1135     Chief Complaint  Patient presents with  . Testicle Pain     (Consider location/radiation/quality/duration/timing/severity/associated sxs/prior Treatment) Patient is a 44 y.o. male presenting with testicular pain. The history is provided by the patient.  Testicle Pain This is a new problem. The current episode started 6 to 12 hours ago. The problem occurs constantly. The problem has not changed since onset.Pertinent negatives include no chest pain, no abdominal pain, no headaches and no shortness of breath. Nothing aggravates the symptoms. Nothing relieves the symptoms. He has tried nothing for the symptoms. The treatment provided no relief.    Past Medical History  Diagnosis Date  . Depression   . GERD (gastroesophageal reflux disease)   . Chronic pain   . ADHD (attention deficit hyperactivity disorder)    Past Surgical History  Procedure Laterality Date  . Fracture surgery     History reviewed. No pertinent family history. History  Substance Use Topics  . Smoking status: Never Smoker   . Smokeless tobacco: Not on file  . Alcohol Use: Yes     Comment: Drinks occasionally and may be wine, beer or liquor    Review of Systems  Constitutional: Negative for fever.  HENT: Negative for drooling and rhinorrhea.   Eyes: Negative for pain.  Respiratory: Negative for cough and shortness of breath.   Cardiovascular: Negative for chest pain and leg swelling.  Gastrointestinal: Negative for nausea, vomiting, abdominal pain and diarrhea.  Genitourinary: Positive for discharge and testicular pain. Negative for dysuria and hematuria.  Musculoskeletal: Negative for gait problem and neck pain.  Skin: Negative for color change.  Neurological: Negative for numbness and headaches.  Hematological: Negative for adenopathy.  Psychiatric/Behavioral: Negative for behavioral  problems.  All other systems reviewed and are negative.     Allergies  Doxycycline and Tetracyclines & related  Home Medications   Prior to Admission medications   Medication Sig Start Date End Date Taking? Authorizing Provider  escitalopram (LEXAPRO) 10 MG tablet Take 10 mg by mouth daily.   Yes Historical Provider, MD  oxycodone (OXYCONTIN) 30 MG TB12 Take 30 mg by mouth every 12 (twelve) hours. Patient uses this medication for pain.   Yes Historical Provider, MD  pantoprazole (PROTONIX) 40 MG tablet Take 40 mg by mouth daily.   Yes Historical Provider, MD  sucralfate (CARAFATE) 1 G tablet Take 1 tablet (1 g total) by mouth 4 (four) times daily -  with meals and at bedtime. 10/21/13   John L Molpus, MD  tacrolimus (PROTOPIC) 0.1 % ointment Apply 1 application topically 2 (two) times daily. Patient uses this medication for skin.    Historical Provider, MD   BP 143/86 mmHg  Pulse 80  Temp(Src) 98.5 F (36.9 C) (Oral)  Resp 18  Ht  (1.956 m)  Wt 265 lb (120.203 kg)  BMI 31.42 kg/m2  SpO2 98% Physical Exam  Constitutional: He is oriented to person, place, and time. He appears well-developed and well-nourished.  HENT:  Head: Normocephalic and atraumatic.  Right Ear: External ear normal.  Left Ear: External ear normal.  Nose: Nose normal.  Mouth/Throat: Oropharynx is clear and moist. No oropharyngeal exudate.  Eyes: Conjunctivae and EOM are normal. Pupils are equal, round, and reactive to light.  Neck: Normal range of motion. Neck supple.  Cardiovascular: Normal rate, regular rhythm, normal heart sounds and  intact distal pulses.  Exam reveals no gallop and no friction rub.   No murmur heard. Pulmonary/Chest: Effort normal and breath sounds normal. No respiratory distress. He has no wheezes.  Abdominal: Soft. Bowel sounds are normal. He exhibits no distension. There is no tenderness. There is no rebound and no guarding.  Genitourinary:  Normal-appearing penis. Small amount  of clear appearing discharge with compression of the penile shaft.  Normal lie of the testicles. Mild tenderness to palpation of the posterior aspect of the left testicle. Normal cremasteric reflex bilaterally. No inguinal hernias noted.  Very mild left inguinal adenopathy.  Musculoskeletal: Normal range of motion. He exhibits no edema or tenderness.  Neurological: He is alert and oriented to person, place, and time.  Skin: Skin is warm and dry.  Psychiatric: He has a normal mood and affect. His behavior is normal.  Nursing note and vitals reviewed.   ED Course  Procedures (including critical care time) Labs Review Labs Reviewed  URINALYSIS, ROUTINE W REFLEX MICROSCOPIC - Abnormal; Notable for the following:    Leukocytes, UA SMALL (*)    All other components within normal limits  URINE MICROSCOPIC-ADD ON  GC/CHLAMYDIA PROBE AMP ([place])    Imaging Review US Scrotum  05/14/2014   CLINICAL DATA:  44 year old male with dull left testicular pain and penile discharge. Initial encounter. Personal history of epididymitis.  EXAM: SCROTAL ULTRASOUND  DOPPLER ULTRASOUND OF THE TESTICLES  TECHNIQUE: Complete ultrasound examination of the testicles, epididymis, and other scrotal structures was performed. Color and spectral Doppler ultrasound were also utilized to evaluate blood flow to the testicles.  COMPARISON:  None.  FINDINGS: Right testicle  Measurements: 4.4 x 2.3 x 2.8 cm. No mass or microlithiasis visualized.  Left testicle  Measurements: 4.3 x 2.3 x 3.4 cm. No mass or microlithiasis visualized. Tiny 3 mm probable scrotal cyst incidentally noted.  Right epididymis: Negative; multiple epididymal head cysts. No hypervascularity demonstrated by Doppler.  Left epididymis: Negative; multiple epididymal head cysts. No hypervascularity demonstrated by Doppler.  Hydrocele:  Small simple appearing right hydrocele.  Varicocele:  Negative.  Pulsed Doppler interrogation of both testes demonstrates  normal low resistance arterial and venous waveforms bilaterally.  IMPRESSION: No evidence of testicular torsion or acute scrotal inflammation. Small right hydrocele.   Electronically Signed   By: Odessa Fleming M.D.   On: 05/14/2014 13:01   [place] Art/ven Flow Abd Pelv Doppler  05/14/2014   CLINICAL DATA:  44 year old male with dull left testicular pain and penile discharge. Initial encounter. Personal history of epididymitis.  EXAM: SCROTAL ULTRASOUND  DOPPLER ULTRASOUND OF THE TESTICLES  TECHNIQUE: Complete ultrasound examination of the testicles, epididymis, and other scrotal structures was performed. Color and spectral Doppler ultrasound were also utilized to evaluate blood flow to the testicles.  COMPARISON:  None.  FINDINGS: Right testicle  Measurements: 4.4 x 2.3 x 2.8 cm. No mass or microlithiasis visualized.  Left testicle  Measurements: 4.3 x 2.3 x 3.4 cm. No mass or microlithiasis visualized. Tiny 3 mm probable scrotal cyst incidentally noted.  Right epididymis: Negative; multiple epididymal head cysts. No hypervascularity demonstrated by Doppler.  Left epididymis: Negative; multiple epididymal head cysts. No hypervascularity demonstrated by Doppler.  Hydrocele:  Small simple appearing right hydrocele.  Varicocele:  Negative.  Pulsed Doppler interrogation of both testes demonstrates normal low resistance arterial and venous waveforms bilaterally.  IMPRESSION: No evidence of testicular torsion or acute scrotal inflammation. Small right hydrocele.   Electronically Signed   By: Althea Grimmer.D.  On: 05/14/2014 13:01     EKG Interpretation None      MDM   Final diagnoses:  Pain in left testicle    12:00 PM 44 y.o. male who presents with left testicular pain and penile discharge which she first noticed this morning. Remote history of chlamydia. Has been with the same partner for 8 months. Has sex only with women. Vital signs unremarkable here. Likely infectious cause of symptoms such as STD but will get  ultrasound due to left testicular pain which is mild on exam. Low suspicion for torsion. Will treat empirically for STDs at patient's request.  1:12 PM: US non-contrib.  I have discussed the diagnosis/risks/treatment options with the patient and believe the pt to be eligible for discharge home to follow-up with his pcp as needed. We also discussed returning to the ED immediately if new or worsening sx occur. We discussed the sx which are most concerning (e.g., continued testicular pain, fever, ) that necessitate immediate return. Medications administered to the patient during their visit and any new prescriptions provided to the patient are listed below.  Medications given during this visit Medications  cefTRIAXone (ROCEPHIN) injection 250 mg (250 mg Intramuscular Given 05/14/14 1205)  metroNIDAZOLE (FLAGYL) tablet 2,000 mg (2,000 mg Oral Given 05/14/14 1206)  azithromycin (ZITHROMAX) tablet 1,000 mg (1,000 mg Oral Given 05/14/14 1205)  acetaminophen (TYLENOL) tablet 650 mg (650 mg Oral Given 05/14/14 1205)    New Prescriptions   No medications on file     Purvis SheffieldForrest Jeronimo Hellberg, MD 05/14/14 1312

## 2014-05-14 NOTE — ED Notes (Signed)
MD at bedside. 

## 2014-05-14 NOTE — ED Notes (Signed)
Pt reports noting left testicular pain that started this morning. Also reports penile drainage.

## 2014-05-14 NOTE — ED Notes (Signed)
Pt refused discharge vital signs

## 2014-05-15 LAB — GC/CHLAMYDIA PROBE AMP (~~LOC~~) NOT AT ARMC
CHLAMYDIA, DNA PROBE: NEGATIVE
NEISSERIA GONORRHEA: NEGATIVE

## 2014-05-20 ENCOUNTER — Telehealth (HOSPITAL_BASED_OUTPATIENT_CLINIC_OR_DEPARTMENT_OTHER): Payer: Self-pay | Admitting: Emergency Medicine

## 2015-03-04 DEATH — deceased

## 2016-11-07 IMAGING — US US SCROTUM
1 series · 13 of 25 positions shown · non-contrast
Comparison: None.

CLINICAL DATA: 43-year-old male with dull left testicular pain and
penile discharge. Initial encounter. Personal history of
epididymitis.

EXAM:
SCROTAL ULTRASOUND
DOPPLER ULTRASOUND OF THE TESTICLES
TECHNIQUE: Complete ultrasound examination of the testicles, epididymis, and
other scrotal structures was performed. Color and spectral Doppler
ultrasound were also utilized to evaluate blood flow to the
testicles.

[Series 1: us scrotum · 0.07mm/px · 13 of 58 slices shown]
[im 1/58]
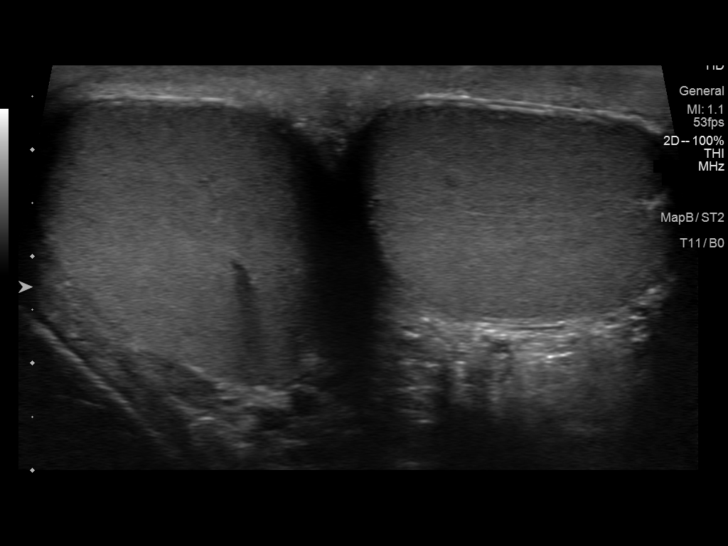
[im 5/58]
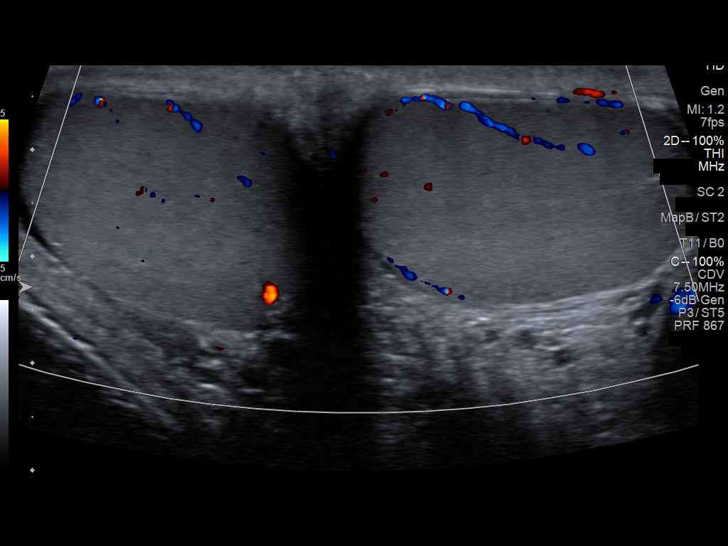
[im 10/58]
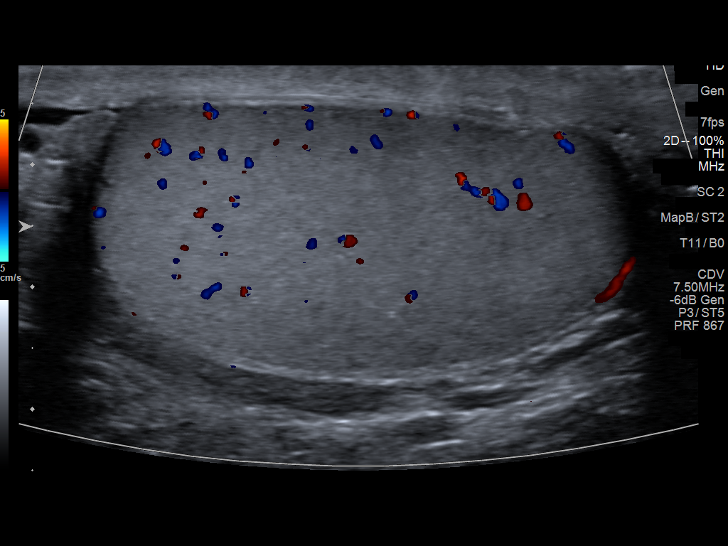
[im 15/58]
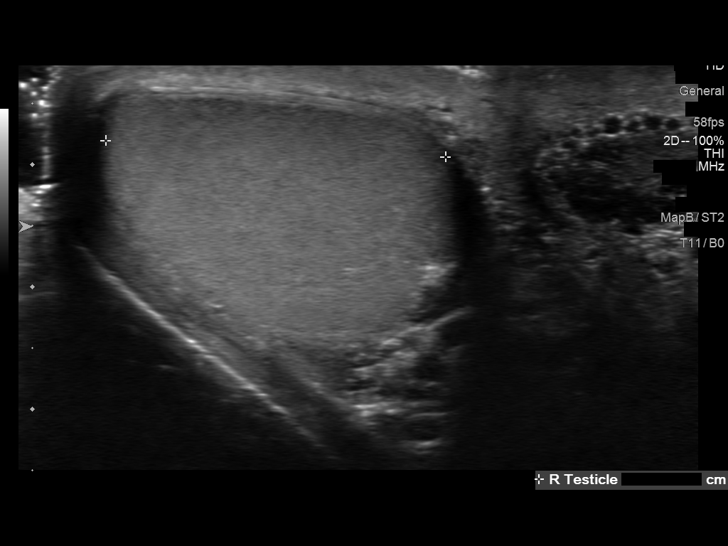
[im 20/58]
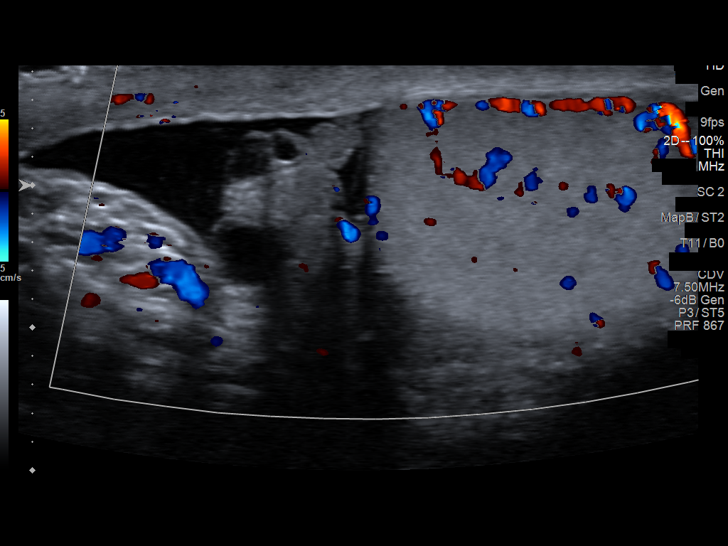
[im 24/58]
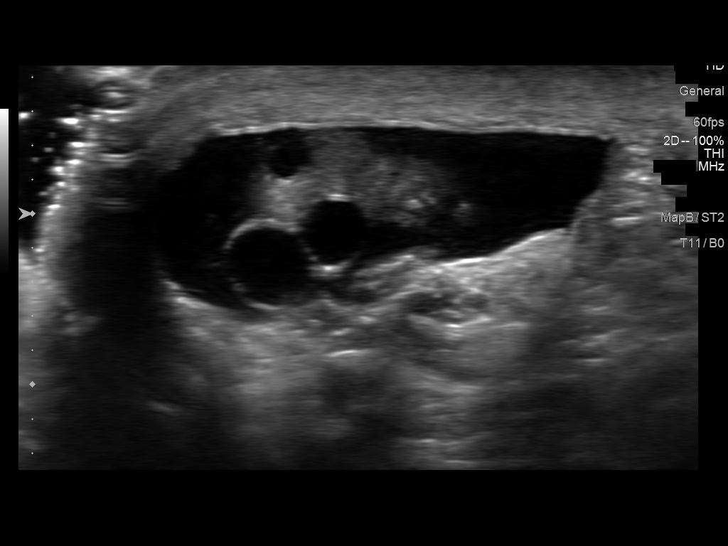
[im 29/58]
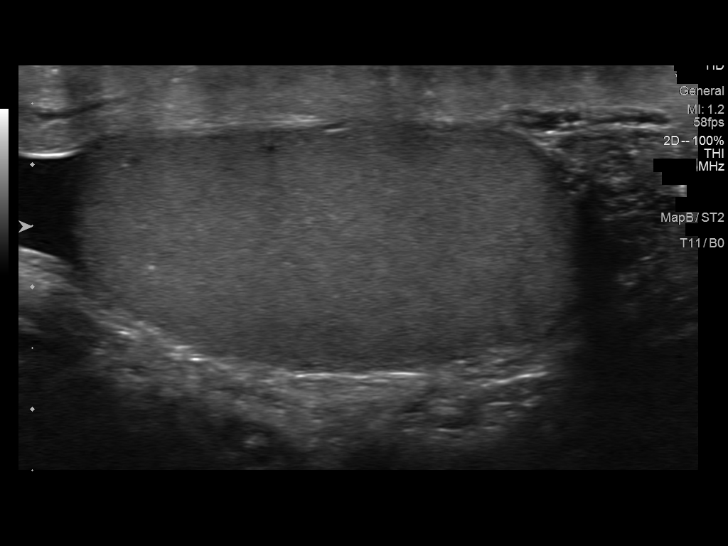
[im 34/58]
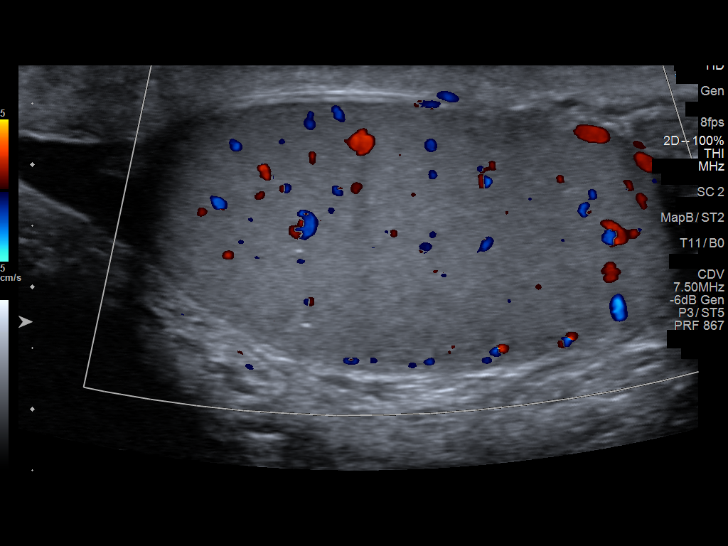
[im 39/58]
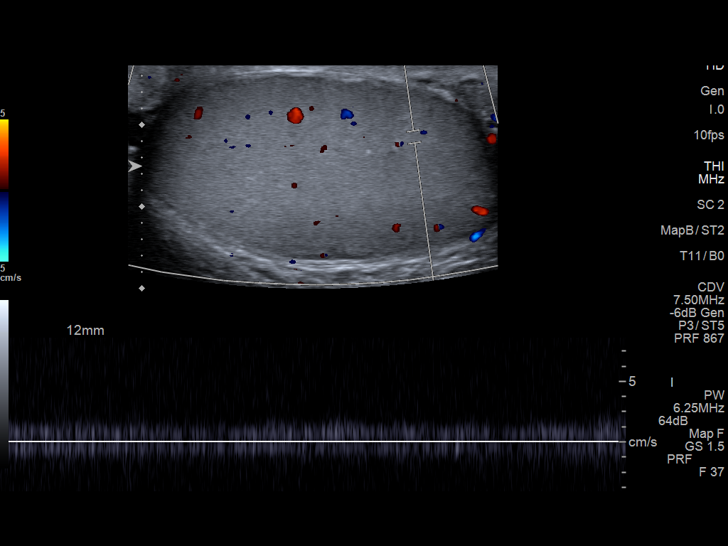
[im 43/58]
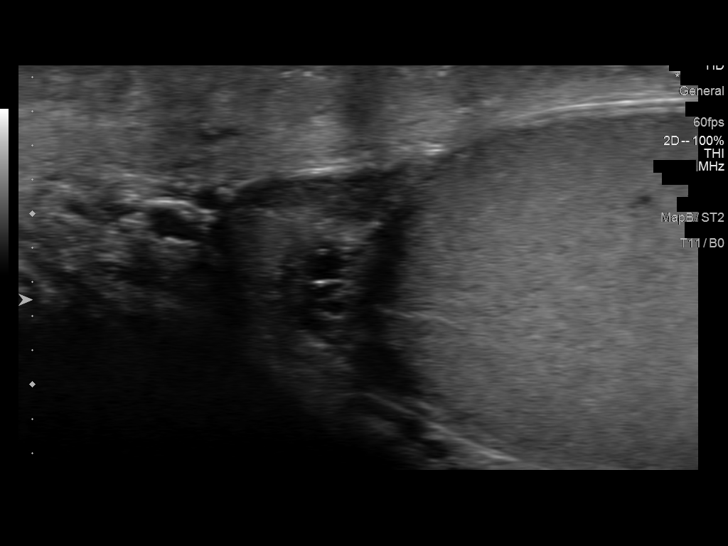
[im 48/58]
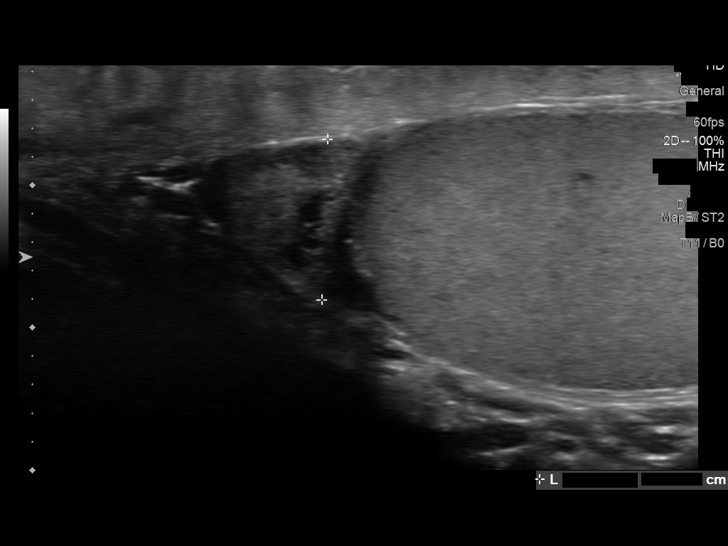
[im 53/58]
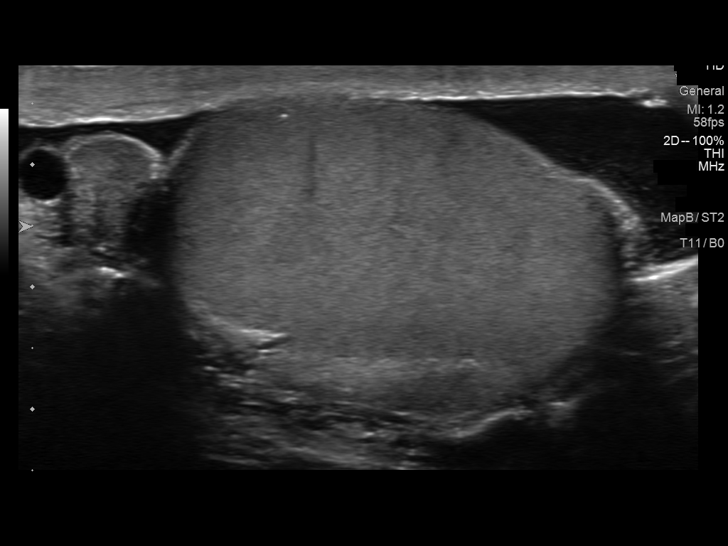
[im 58/58]
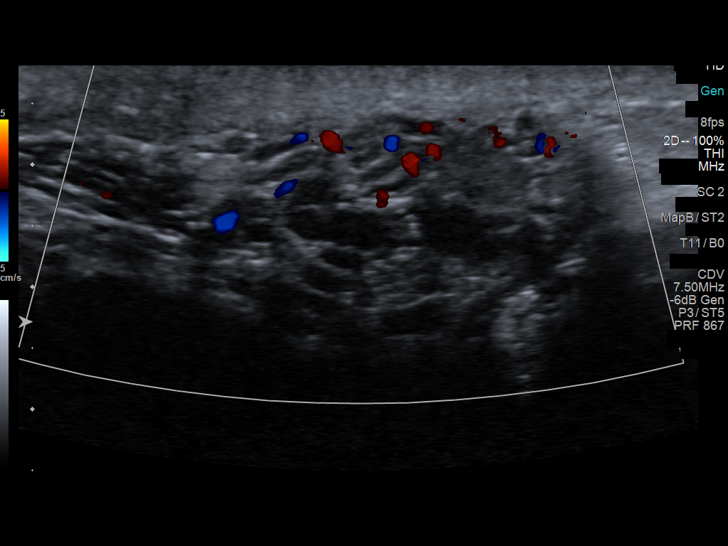

[13 of 25 positions shown; findings below may reference images not displayed]

FINDINGS: Right testicle

Measurements: 4.4 x 2.3 x 2.8 cm. No mass or microlithiasis
visualized.

Left testicle

Measurements: 4.3 x 2.3 x 3.4 cm. No mass or microlithiasis
visualized. Tiny 3 mm probable scrotal cyst incidentally noted.

Right epididymis: Negative; multiple epididymal head cysts. No
hypervascularity demonstrated by Doppler.

Left epididymis: Negative; multiple epididymal head cysts. No
hypervascularity demonstrated by Doppler.

Hydrocele:  Small simple appearing right hydrocele.

Varicocele:  Negative.

Pulsed Doppler interrogation of both testes demonstrates normal low
resistance arterial and venous waveforms bilaterally.
IMPRESSION: No evidence of testicular torsion or acute scrotal inflammation.
Small right hydrocele.
